# Patient Record
Sex: Male | Born: 1955 | Race: White | Hispanic: No | Marital: Married | Smoking: Current every day smoker
Health system: Southern US, Community
[De-identification: ages and names within clinical notes are randomized; demographics above are authoritative.]

## PROBLEM LIST (undated history)

## (undated) DIAGNOSIS — K449 Diaphragmatic hernia without obstruction or gangrene: Secondary | ICD-10-CM

## (undated) DIAGNOSIS — K219 Gastro-esophageal reflux disease without esophagitis: Secondary | ICD-10-CM

## (undated) DIAGNOSIS — L309 Dermatitis, unspecified: Secondary | ICD-10-CM

## (undated) DIAGNOSIS — I10 Essential (primary) hypertension: Secondary | ICD-10-CM

## (undated) HISTORY — PX: ROTATOR CUFF REPAIR: SHX139

---

## 2007-10-09 ENCOUNTER — Emergency Department: Payer: Self-pay | Admitting: Unknown Physician Specialty

## 2007-10-09 ENCOUNTER — Other Ambulatory Visit: Payer: Self-pay

## 2013-11-11 ENCOUNTER — Ambulatory Visit: Payer: Self-pay | Admitting: Nurse Practitioner

## 2018-09-08 DIAGNOSIS — Z720 Tobacco use: Secondary | ICD-10-CM | POA: Insufficient documentation

## 2019-05-17 ENCOUNTER — Other Ambulatory Visit: Payer: Self-pay | Admitting: Internal Medicine

## 2019-05-17 DIAGNOSIS — K219 Gastro-esophageal reflux disease without esophagitis: Secondary | ICD-10-CM

## 2019-05-17 DIAGNOSIS — R1310 Dysphagia, unspecified: Secondary | ICD-10-CM

## 2019-05-20 ENCOUNTER — Other Ambulatory Visit: Payer: Self-pay

## 2019-05-20 ENCOUNTER — Ambulatory Visit
Admission: RE | Admit: 2019-05-20 | Discharge: 2019-05-20 | Disposition: A | Discharge status: Home / Self Care | Payer: Medicare Other | Source: Ambulatory Visit | Attending: Internal Medicine | Admitting: Internal Medicine

## 2019-05-20 DIAGNOSIS — K219 Gastro-esophageal reflux disease without esophagitis: Secondary | ICD-10-CM | POA: Diagnosis not present

## 2019-05-20 DIAGNOSIS — R1310 Dysphagia, unspecified: Secondary | ICD-10-CM

## 2019-06-06 ENCOUNTER — Other Ambulatory Visit: Payer: Self-pay

## 2019-06-06 ENCOUNTER — Encounter: Payer: Self-pay | Admitting: Gastroenterology

## 2019-06-06 ENCOUNTER — Ambulatory Visit: Payer: Medicare Other | Admitting: Gastroenterology

## 2019-06-06 VITALS — BP 155/97 | HR 77 | Temp 98.1°F | Ht 65.0 in | Wt 179.5 lb

## 2019-06-06 DIAGNOSIS — R1319 Other dysphagia: Secondary | ICD-10-CM

## 2019-06-06 DIAGNOSIS — R1310 Dysphagia, unspecified: Secondary | ICD-10-CM | POA: Diagnosis not present

## 2019-06-06 DIAGNOSIS — L309 Dermatitis, unspecified: Secondary | ICD-10-CM | POA: Insufficient documentation

## 2019-06-06 DIAGNOSIS — B019 Varicella without complication: Secondary | ICD-10-CM | POA: Insufficient documentation

## 2019-06-06 NOTE — Patient Instructions (Signed)

## 2019-06-06 NOTE — Progress Notes (Signed)
Arlyss Repress, MD [address]  [address]  [place], [place] [address]  Main: [phone]  Fax: [phone]    Gastroenterology Consultation  Referring Provider:     Marguarite Arbour, MD Primary Care Physician:  Marguarite Arbour, MD Primary Gastroenterologist:  Dr. Arlyss Repress Reason for Consultation:     Dysphagia        HPI:   Rasul Decola Nill is a 64 y.o. male referred by Dr. Judithann Sheen, Duane Lope, MD  for consultation & management of dysphagia  Patient was originally seen by Dr. Judithann Sheen on 05/17/2019 secondary to approximately 1 year history of sensation of food stuck, gas bubble in his throat.  For last 2 months, he noticed worsening dysphagia and choking.  He had no heartburn or indigestion.  Denies nausea or vomiting, change in bowel habits, abdominal pain, patient underwent barium esophagogram which revealed irregular thickening of the distal esophagus, small hiatal hernia.  Therefore, GI is consulted for further evaluation Patient is currently on Protonix 40 mg twice daily which has remarkably help with his symptoms.  He acknowledges drinking 3 to 4 cans of sodas daily, consumes red meat regularly  He smokes cigars regularly Patient does not want a colonoscopy, he is undergoing stool testing for colon cancer screening  He denies family history of GI malignancy  NSAIDs: None  Antiplts/Anticoagulants/Anti thrombotics: None  GI Procedures: None  History reviewed. No pertinent past medical history.  History reviewed. No pertinent surgical history.  Current Outpatient Medications:  .  acetaminophen (TYLENOL) 325 MG tablet, Take by mouth., Disp: , Rfl:  .  clobetasol ointment (TEMOVATE) 0.05 %, Apply twice daily to raised itchy areas until smooth, Disp: , Rfl:  .  pantoprazole (PROTONIX) 40 MG tablet, Take 40 mg by mouth 2 (two) times daily., Disp: , Rfl:     History reviewed. No pertinent family history.   Social History   Tobacco Use  . Smoking status:  Current Every Day Smoker    Types: Cigars  . Smokeless tobacco: Never Used  Substance Use Topics  . Alcohol use: Not Currently  . Drug use: Never    Allergies as of 06/06/2019  . (No Known Allergies)    Review of Systems:    All systems reviewed and negative except where noted in HPI.   Physical Exam:  BP (!) 155/97 (BP Location: Left Arm, Patient Position: Sitting, Cuff Size: Normal)   Pulse 77   Temp 98.1 F (36.7 C) (Oral)   Ht 5\' 5"  (1.651 m)   Wt 179 lb 8 oz (81.4 kg)   BMI 29.87 kg/m  No LMP for male patient.  General:   Alert,  Well-developed, well-nourished, pleasant and cooperative in NAD Head:  Normocephalic and atraumatic. Eyes:  Sclera clear, no icterus.   Conjunctiva pink. Ears:  Normal auditory acuity. Nose:  No deformity, discharge, or lesions. Mouth:  No deformity or lesions,oropharynx pink & moist. Neck:  Supple; no masses or thyromegaly. Lungs:  Respirations even and unlabored.  Clear throughout to auscultation.   No wheezes, crackles, or rhonchi. No acute distress. Heart:  Regular rate and rhythm; no murmurs, clicks, rubs, or gallops. Abdomen:  Normal bowel sounds. Soft, obese, non-tender and moderately distended without masses, hepatosplenomegaly or hernias noted.  No guarding or rebound tenderness.   Rectal: Not performed Msk:  Symmetrical without gross deformities. Good, equal movement & strength bilaterally. Pulses:  Normal pulses noted. Extremities:  No clubbing or edema.  No cyanosis. Neurologic:  Alert and oriented x3;  grossly normal neurologically. Skin:  Intact without significant lesions or rashes. No jaundice. Psych:  Alert and cooperative. Normal mood and affect.  Imaging Studies: X-ray esophagus IMPRESSION: 1. Mild narrowing and irregularity of the distal esophagus at the gastroesophageal junction. To exclude a focal fixed lesion endoscopic evaluation is suggested.  2. Small sliding hiatal hernia. No reflux. Standard barium  tablet passes normally.  Assessment and Plan:   Noelle Hoogland Smyth is a 64 y.o. male with history of worsening dysphagia, symptoms currently relieved on acid suppressive therapy  Chronic dysphagia to solids: Continue Protonix 40 mg twice daily before meals Discussed about antireflux lifestyle, information provided EGD with proximal to distal esophageal biopsies as well as to evaluate for any structural lesions including malignancy  Follow up in 2 months   Cephas Darby, MD

## 2019-06-29 ENCOUNTER — Other Ambulatory Visit: Admission: RE | Admit: 2019-06-29 | Payer: Medicare Other | Source: Ambulatory Visit

## 2019-08-03 ENCOUNTER — Ambulatory Visit: Payer: Medicare Other | Admitting: Gastroenterology

## 2019-09-12 ENCOUNTER — Telehealth: Payer: Self-pay

## 2019-09-12 NOTE — Telephone Encounter (Signed)
Patients wife contacted the office to cancel his EGD.  She said she will need to reschedule it due to-she is going to have surgery this month.  She will call back to reschedule at a later time.  Thanks,  [place], [place]

## 2019-09-19 ENCOUNTER — Encounter: Admission: RE | Payer: Self-pay | Source: Home / Self Care

## 2019-09-19 ENCOUNTER — Ambulatory Visit: Admission: RE | Admit: 2019-09-19 | Payer: Medicare Other | Source: Home / Self Care | Admitting: Gastroenterology

## 2019-09-19 SURGERY — ESOPHAGOGASTRODUODENOSCOPY (EGD) WITH PROPOFOL
Anesthesia: General

## 2019-09-27 ENCOUNTER — Other Ambulatory Visit: Payer: Self-pay | Admitting: Internal Medicine

## 2019-09-27 DIAGNOSIS — R9389 Abnormal findings on diagnostic imaging of other specified body structures: Secondary | ICD-10-CM

## 2019-10-17 ENCOUNTER — Other Ambulatory Visit: Payer: Self-pay

## 2019-10-17 ENCOUNTER — Ambulatory Visit
Admission: RE | Admit: 2019-10-17 | Discharge: 2019-10-17 | Disposition: A | Discharge status: Home / Self Care | Payer: Medicare Other | Source: Ambulatory Visit | Attending: Internal Medicine | Admitting: Internal Medicine

## 2019-10-17 DIAGNOSIS — R9389 Abnormal findings on diagnostic imaging of other specified body structures: Secondary | ICD-10-CM | POA: Diagnosis not present

## 2019-10-19 ENCOUNTER — Ambulatory Visit: Payer: Medicare Other | Admitting: Gastroenterology

## 2021-05-23 IMAGING — CT CT CHEST W/O CM
2 of 4 series · 15 of 36 positions shown, 18 images · non-contrast
Comparison: By report from 09/23/2019

CLINICAL DATA: Follow-up abnormal chest x-ray

EXAM:
CT CHEST WITHOUT CONTRAST
TECHNIQUE: Multidetector CT imaging of the chest was performed following the
standard protocol without IV contrast.

[Series 2: thorax · axial · 0.68mm/px · z∈[-326,-76]mm · 12 of 147 slices shown, 15 images]
[im 11/147  mediastinal]
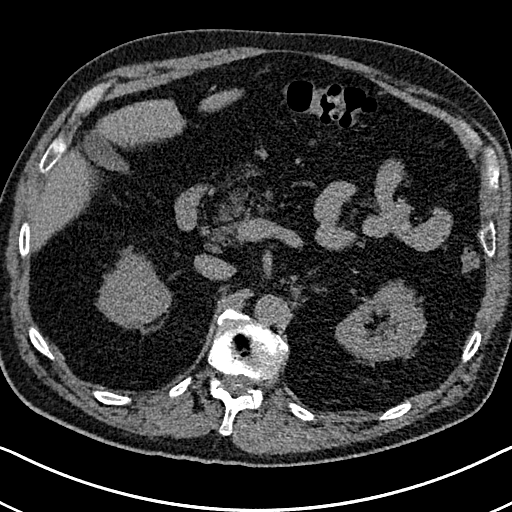
[im 11/147  lung]
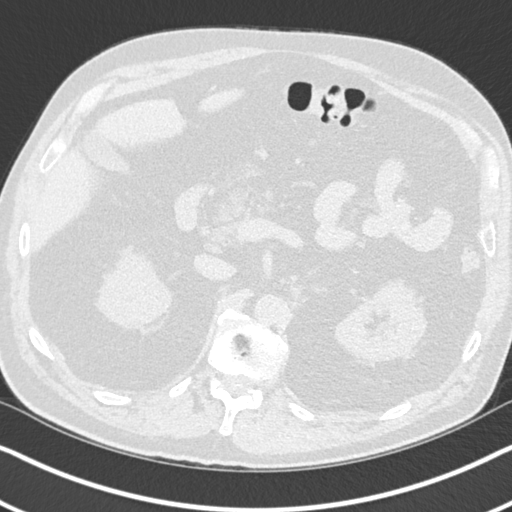
[im 21/147  lung]
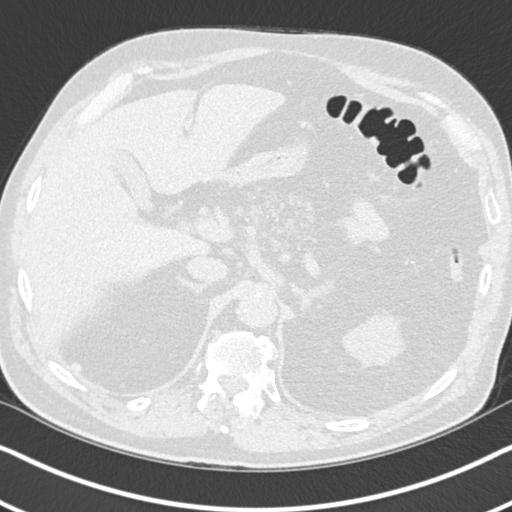
[im 32/147  lung]
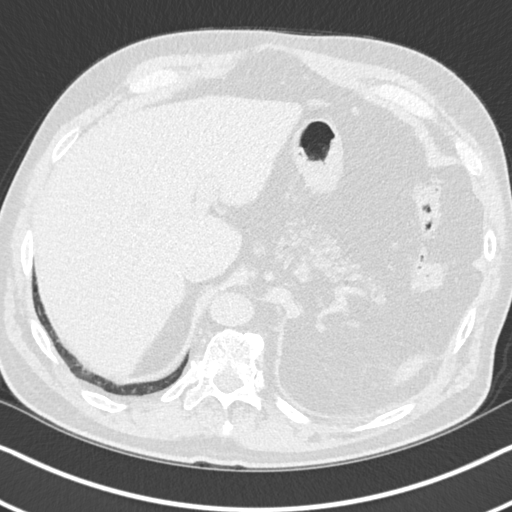
[im 42/147  lung]
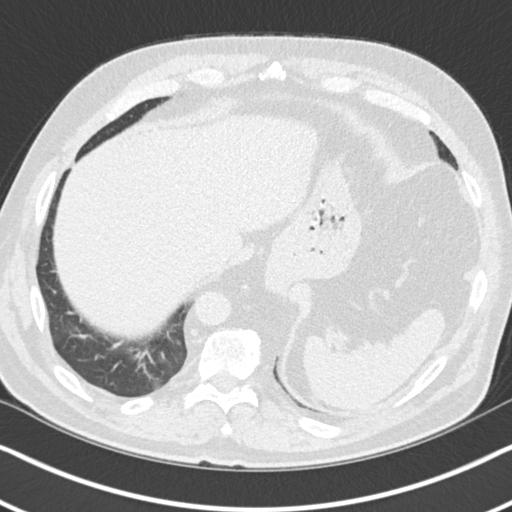
[im 53/147  mediastinal]
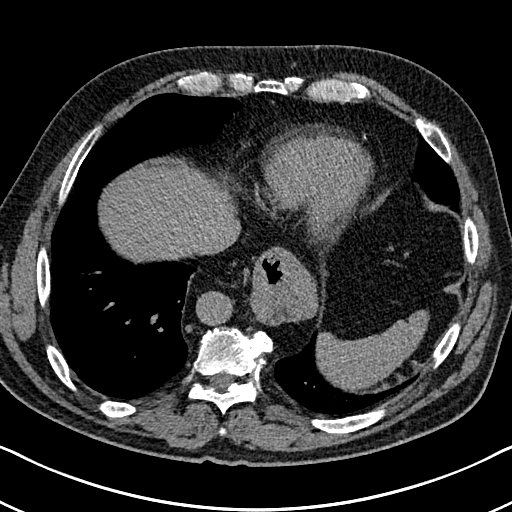
[im 53/147  lung]
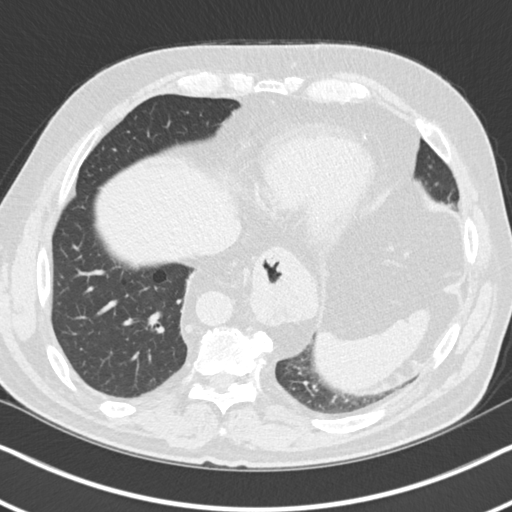
[im 63/147  lung]
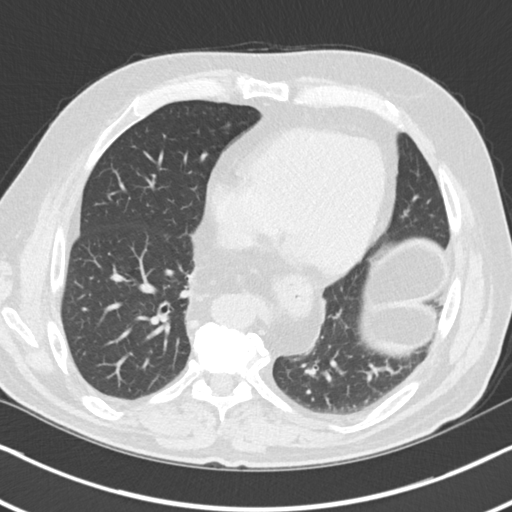
[im 84/147  lung]
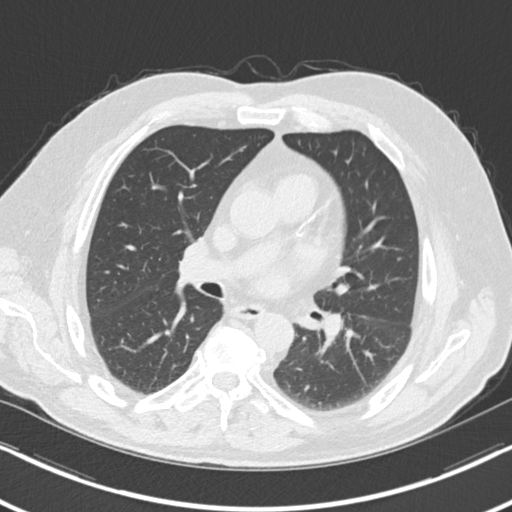
[im 94/147  lung]
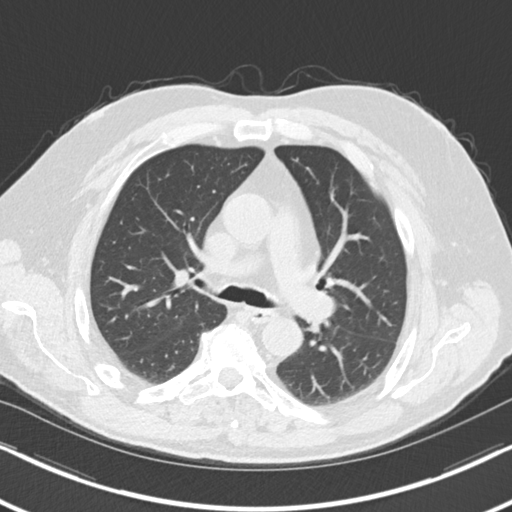
[im 105/147  mediastinal]
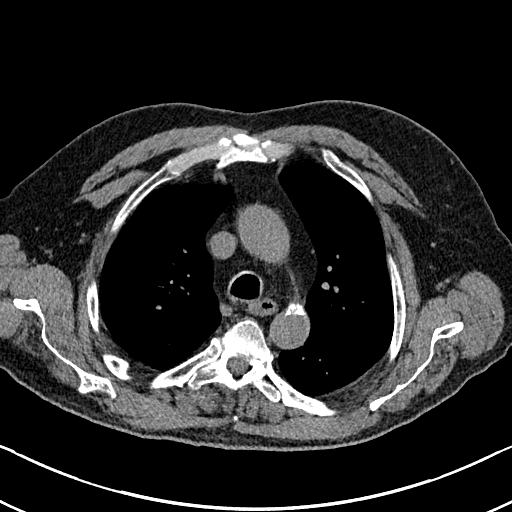
[im 105/147  lung]
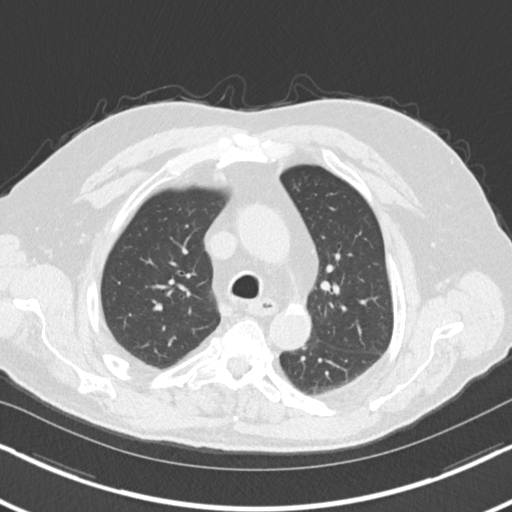
[im 115/147  lung]
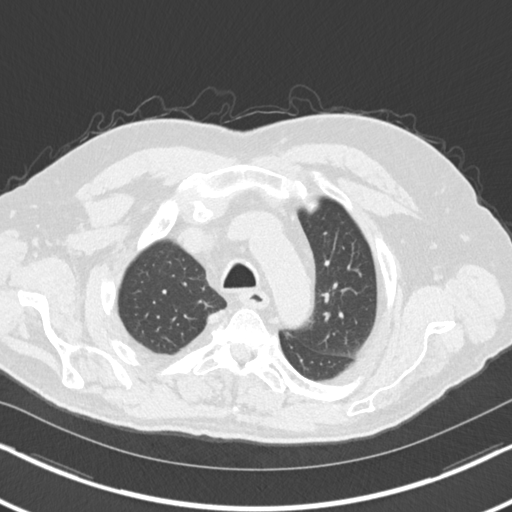
[im 126/147  lung]
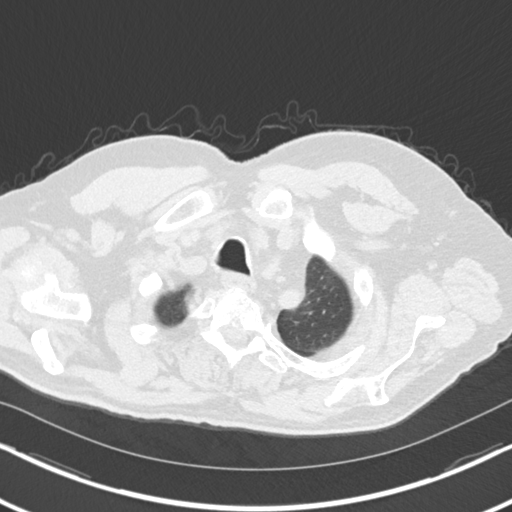
[im 136/147  lung]
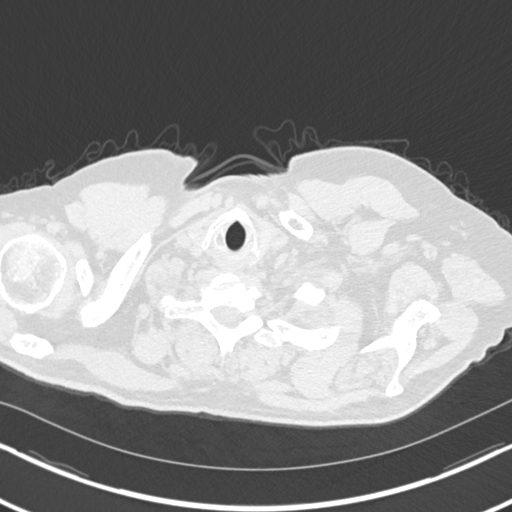

[Series 5: coronal · coronal · 0.61mm/px · 3 of 151 slices shown]
[im 31/151  lung]
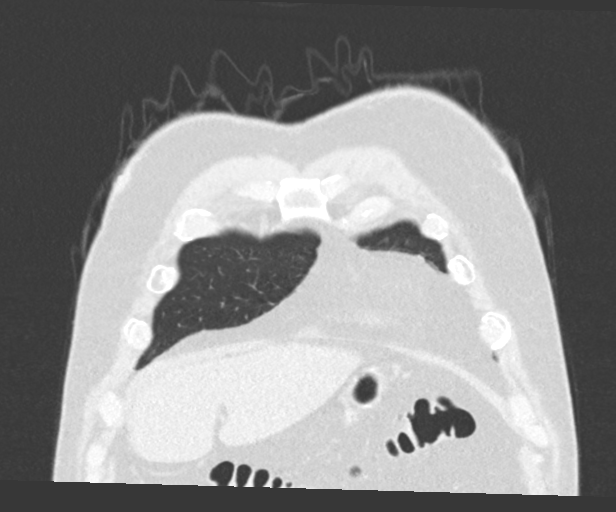
[im 61/151  lung]
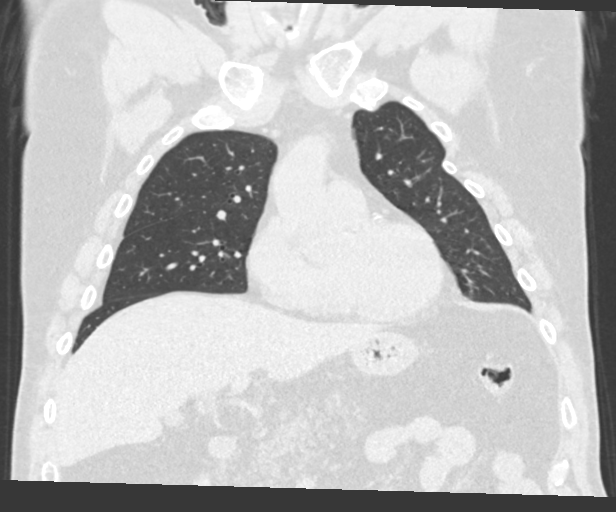
[im 91/151  lung]
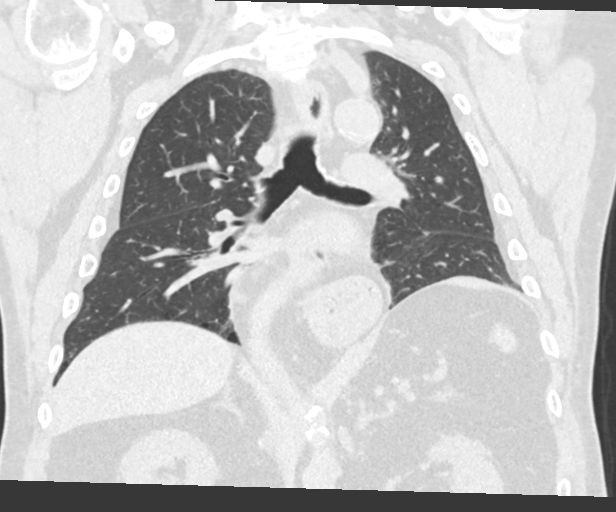

[15 of 36 positions shown; findings below may reference images not displayed]

FINDINGS: Cardiovascular: Limited by the lack of IV contrast. Aortic
calcifications are noted without aneurysmal dilatation. No cardiac
enlargement is seen. Coronary calcifications are noted. Pulmonary
artery is within normal limits as visualized.

Mediastinum/Nodes: Thoracic inlet is unremarkable. No sizable hilar
or mediastinal adenopathy is noted. The esophagus shows evidence of
a moderate sliding-type hiatal hernia.

Lungs/Pleura: The lungs are well aerated bilaterally. No focal
infiltrate or sizable effusion is seen. Previously noted atelectatic
changes are not appreciated on this exam. No pulmonary nodule is
seen.

Upper Abdomen: Visualized upper abdomen demonstrates fatty
interdigitation in the pancreas. No other focal abnormality is
noted.

Musculoskeletal: Degenerative changes of the thoracic spine are
seen. No acute bony abnormality is noted.
IMPRESSION: Previously described atelectatic changes and effusions are not
appreciated on today's exam and were likely related to poor
inspiratory effort.

Moderate-sized hiatal hernia.

Aortic Atherosclerosis ([id number]).

## 2023-01-22 DIAGNOSIS — Z125 Encounter for screening for malignant neoplasm of prostate: Secondary | ICD-10-CM | POA: Diagnosis not present

## 2023-01-22 DIAGNOSIS — E7800 Pure hypercholesterolemia, unspecified: Secondary | ICD-10-CM | POA: Diagnosis not present

## 2023-03-05 DIAGNOSIS — L570 Actinic keratosis: Secondary | ICD-10-CM | POA: Diagnosis not present

## 2023-03-05 DIAGNOSIS — L309 Dermatitis, unspecified: Secondary | ICD-10-CM | POA: Diagnosis not present

## 2023-03-05 DIAGNOSIS — Z85828 Personal history of other malignant neoplasm of skin: Secondary | ICD-10-CM | POA: Diagnosis not present

## 2023-03-05 DIAGNOSIS — L821 Other seborrheic keratosis: Secondary | ICD-10-CM | POA: Diagnosis not present

## 2023-03-05 DIAGNOSIS — Z08 Encounter for follow-up examination after completed treatment for malignant neoplasm: Secondary | ICD-10-CM | POA: Diagnosis not present

## 2023-03-05 DIAGNOSIS — L578 Other skin changes due to chronic exposure to nonionizing radiation: Secondary | ICD-10-CM | POA: Diagnosis not present

## 2023-03-18 DIAGNOSIS — I10 Essential (primary) hypertension: Secondary | ICD-10-CM | POA: Diagnosis not present

## 2023-03-18 DIAGNOSIS — Z79899 Other long term (current) drug therapy: Secondary | ICD-10-CM | POA: Diagnosis not present

## 2023-03-18 DIAGNOSIS — K219 Gastro-esophageal reflux disease without esophagitis: Secondary | ICD-10-CM | POA: Diagnosis not present

## 2023-03-18 DIAGNOSIS — Z720 Tobacco use: Secondary | ICD-10-CM | POA: Diagnosis not present

## 2023-03-18 DIAGNOSIS — E7800 Pure hypercholesterolemia, unspecified: Secondary | ICD-10-CM | POA: Diagnosis not present

## 2023-03-18 DIAGNOSIS — Z125 Encounter for screening for malignant neoplasm of prostate: Secondary | ICD-10-CM | POA: Diagnosis not present

## 2023-03-18 DIAGNOSIS — E039 Hypothyroidism, unspecified: Secondary | ICD-10-CM | POA: Diagnosis not present

## 2023-04-21 ENCOUNTER — Emergency Department

## 2023-04-21 ENCOUNTER — Other Ambulatory Visit: Payer: Self-pay

## 2023-04-21 DIAGNOSIS — R188 Other ascites: Secondary | ICD-10-CM | POA: Diagnosis not present

## 2023-04-21 DIAGNOSIS — R1013 Epigastric pain: Secondary | ICD-10-CM | POA: Diagnosis not present

## 2023-04-21 DIAGNOSIS — K449 Diaphragmatic hernia without obstruction or gangrene: Secondary | ICD-10-CM | POA: Diagnosis not present

## 2023-04-21 DIAGNOSIS — J9811 Atelectasis: Secondary | ICD-10-CM | POA: Diagnosis not present

## 2023-04-21 DIAGNOSIS — K5790 Diverticulosis of intestine, part unspecified, without perforation or abscess without bleeding: Secondary | ICD-10-CM | POA: Diagnosis not present

## 2023-04-21 DIAGNOSIS — I700 Atherosclerosis of aorta: Secondary | ICD-10-CM | POA: Diagnosis not present

## 2023-04-21 DIAGNOSIS — K760 Fatty (change of) liver, not elsewhere classified: Secondary | ICD-10-CM | POA: Diagnosis not present

## 2023-04-21 DIAGNOSIS — R079 Chest pain, unspecified: Secondary | ICD-10-CM | POA: Diagnosis not present

## 2023-04-21 DIAGNOSIS — R109 Unspecified abdominal pain: Secondary | ICD-10-CM | POA: Diagnosis not present

## 2023-04-21 DIAGNOSIS — R0789 Other chest pain: Secondary | ICD-10-CM | POA: Diagnosis not present

## 2023-04-21 LAB — COMPREHENSIVE METABOLIC PANEL WITH GFR
ALT: 22 U/L (ref 0–44)
AST: 18 U/L (ref 15–41)
Albumin: 4.2 g/dL (ref 3.5–5.0)
Alkaline Phosphatase: 85 U/L (ref 38–126)
Anion gap: 11 (ref 5–15)
BUN: 12 mg/dL (ref 8–23)
CO2: 20 mmol/L — ABNORMAL LOW (ref 22–32)
Calcium: 9.7 mg/dL (ref 8.9–10.3)
Chloride: 101 mmol/L (ref 98–111)
Creatinine, Ser: 0.92 mg/dL (ref 0.61–1.24)
GFR, Estimated: >60 mL/min (ref >60)
Glucose, Bld: 175 mg/dL — ABNORMAL HIGH (ref 70–99)
Potassium: 3.9 mmol/L (ref 3.5–5.1)
Sodium: 132 mmol/L — ABNORMAL LOW (ref 135–145)
Total Bilirubin: 1 mg/dL (ref 0.0–1.2)
Total Protein: 7.5 g/dL (ref 6.5–8.1)

## 2023-04-21 LAB — CBC
HCT: 51.4 % (ref 39.0–52.0)
Hemoglobin: 18.1 g/dL — ABNORMAL HIGH (ref 13.0–17.0)
MCH: 29.9 pg (ref 26.0–34.0)
MCHC: 35.2 g/dL (ref 30.0–36.0)
MCV: 84.8 fL (ref 80.0–100.0)
Platelets: 291 10*3/uL (ref 150–400)
RBC: 6.06 MIL/uL — ABNORMAL HIGH (ref 4.22–5.81)
RDW: 13.3 % (ref 11.5–15.5)
WBC: 9.2 10*3/uL (ref 4.0–10.5)
nRBC: 0 % (ref 0.0–0.2)

## 2023-04-21 LAB — LIPASE, BLOOD: Lipase: 24 U/L (ref 11–51)

## 2023-04-21 LAB — TROPONIN I (HIGH SENSITIVITY): Troponin I (High Sensitivity): 4 ng/L (ref <18)

## 2023-04-21 NOTE — ED Triage Notes (Addendum)
 Pt to ED via POV c/o abd pain that starts at umbilicus and radiates up to mid chest. Started last night and has been worsening. Pt SHOB, dizziness, fevers.

## 2023-04-22 ENCOUNTER — Emergency Department
Admission: EM | Admit: 2023-04-22 | Discharge: 2023-04-22 | Disposition: A | Discharge status: Home / Self Care | Attending: Emergency Medicine | Admitting: Emergency Medicine

## 2023-04-22 ENCOUNTER — Emergency Department

## 2023-04-22 DIAGNOSIS — K5790 Diverticulosis of intestine, part unspecified, without perforation or abscess without bleeding: Secondary | ICD-10-CM | POA: Diagnosis not present

## 2023-04-22 DIAGNOSIS — R109 Unspecified abdominal pain: Secondary | ICD-10-CM

## 2023-04-22 DIAGNOSIS — R188 Other ascites: Secondary | ICD-10-CM | POA: Diagnosis not present

## 2023-04-22 DIAGNOSIS — K449 Diaphragmatic hernia without obstruction or gangrene: Secondary | ICD-10-CM | POA: Diagnosis not present

## 2023-04-22 LAB — URINALYSIS, ROUTINE W REFLEX MICROSCOPIC
Bilirubin Urine: NEGATIVE
Glucose, UA: NEGATIVE mg/dL
Hgb urine dipstick: NEGATIVE
Ketones, ur: NEGATIVE mg/dL
Leukocytes,Ua: NEGATIVE
Nitrite: NEGATIVE
Protein, ur: NEGATIVE mg/dL
Specific Gravity, Urine: >1.046 — ABNORMAL HIGH (ref 1.005–1.030)
pH: 5 (ref 5.0–8.0)

## 2023-04-22 LAB — TROPONIN I (HIGH SENSITIVITY): Troponin I (High Sensitivity): 3 ng/L (ref <18)

## 2023-04-22 MED ORDER — FAMOTIDINE 20 MG PO TABS: 20.0000 mg | ORAL_TABLET | Freq: Two times a day (BID) | ORAL | 0 refills | Status: AC

## 2023-04-22 MED ORDER — LIDOCAINE VISCOUS HCL 2 % MT SOLN
15.0000 mL | Freq: Once | OROMUCOSAL | Status: AC
  Administered 2023-04-22: 15 mL via OROMUCOSAL
  Filled 2023-04-22: qty 15

## 2023-04-22 MED ORDER — FAMOTIDINE IN NACL 20-0.9 MG/50ML-% IV SOLN
20.0000 mg | Freq: Once | INTRAVENOUS | Status: AC
  Administered 2023-04-22: 20 mg via INTRAVENOUS
  Filled 2023-04-22: qty 50

## 2023-04-22 MED ORDER — ALUM & MAG HYDROXIDE-SIMETH 200-200-20 MG/5ML PO SUSP
30.0000 mL | Freq: Once | ORAL | Status: AC
  Administered 2023-04-22: 30 mL via ORAL
  Filled 2023-04-22: qty 30

## 2023-04-22 MED ORDER — IOHEXOL 300 MG/ML  SOLN
100.0000 mL | Freq: Once | INTRAMUSCULAR | Status: AC | PRN
  Administered 2023-04-22: 100 mL via INTRAVENOUS

## 2023-04-22 NOTE — Discharge Instructions (Addendum)
 Start Pepcid twice daily.  Bland diet x 1 week, then slowly advance diet as tolerated.  Return to the ER for worsening symptoms, persistent vomiting, difficulty breathing or other concerns.

## 2023-04-22 NOTE — ED Provider Notes (Signed)
 [facility] Provider Note    Event Date/Time   First MD Initiated Contact with Patient 04/22/23 [phone]     (approximate)   History   Abdominal Pain and Chest Pain   HPI  Jonathan Rhodes is a 68 y.o. male who presents to the ED from home with a chief complaint of abdominal pain which began last night.  Reports central abdominal pain starting at his umbilicus radiating to his epigastrium/lower chest.  Endorses brief nausea.  Denies fever/chills, shortness of breath, vomiting, dysuria or diarrhea.  Wife states patient has been coughing but not "like a cold cough".     Past Medical History  History reviewed. No pertinent past medical history.   Active Problem List   Patient Active Problem List   Diagnosis Date Noted   Chickenpox 06/06/2019   Eczema 06/06/2019   Tobacco use 09/08/2018     Past Surgical History  History reviewed. No pertinent surgical history.   Home Medications   Prior to Admission medications   Medication Sig Start Date End Date Taking? Authorizing Provider  famotidine (PEPCID) 20 MG tablet Take 1 tablet (20 mg total) by mouth 2 (two) times daily. 04/22/23  Yes Irean Hong, MD  acetaminophen (TYLENOL) 325 MG tablet Take by mouth.    [provider]  clobetasol ointment (TEMOVATE) 0.05 % Apply twice daily to raised itchy areas until smooth 08/27/18   [provider]  pantoprazole (PROTONIX) 40 MG tablet Take 40 mg by mouth 2 (two) times daily. 05/17/19   [provider]     Allergies  Patient has no known allergies.   Family History  History reviewed. No pertinent family history.   Physical Exam  Triage Vital Signs: ED Triage Vitals [04/21/23 2244]  Encounter Vitals Group     BP (!) 152/100     Systolic BP Percentile      Diastolic BP Percentile      Pulse Rate (!) 103     Resp 18     Temp 97.6 F (36.4 C)     Temp Source Oral     SpO2 95 %     Weight 192 lb (87.1 kg)     Height 5\' 6"   (1.676 m)     Head Circumference      Peak Flow      Pain Score 9     Pain Loc      Pain Education      Exclude from Growth Chart     Updated Vital Signs: BP (!) 149/92   Pulse 87   Temp 98 F (36.7 C) (Oral)   Resp (!) 26   Ht 5\' 6"  (1.676 m)   Wt 87.1 kg   SpO2 94%   BMI 30.99 kg/m    General: Awake, no distress.  CV:  RRR.  Good peripheral perfusion.  Resp:  Normal effort.  CTAB. Abd:  Minimal epigastric tenderness to palpation without rebound or guarding. No distention.  Other:  No truncal vesicles.  ED Results / Procedures / Treatments  Labs (all labs ordered are listed, but only abnormal results are displayed) Labs Reviewed  COMPREHENSIVE METABOLIC PANEL WITH GFR - Abnormal; Notable for the following components:      Result Value   Sodium 132 (*)    CO2 20 (*)    Glucose, Bld 175 (*)    All other components within normal limits  CBC - Abnormal; Notable for the following components:   RBC  6.06 (*)    Hemoglobin 18.1 (*)    All other components within normal limits  URINALYSIS, ROUTINE W REFLEX MICROSCOPIC - Abnormal; Notable for the following components:   Color, Urine STRAW (*)    APPearance CLEAR (*)    Specific Gravity, Urine >1.046 (*)    All other components within normal limits  LIPASE, BLOOD  TROPONIN I (HIGH SENSITIVITY)  TROPONIN I (HIGH SENSITIVITY)     EKG  ED ECG REPORT I, Lynnmarie Lovett J, the attending physician, personally viewed and interpreted this ECG.   Date: 04/22/2023  EKG Time: 2241  Rate: 110  Rhythm: sinus tachycardia  Axis: Normal  Intervals:none  ST&T Change: Nonspecific    RADIOLOGY I have independently visualized and interpreted patient's imaging studies as well as noted the radiology interpretation:  Chest x-ray: No acute cardiopulmonary process, dilated small bowel loops in upper abdomen  Ultrasound: Hepatic steatosis  CT abdomen/pelvis: No acute intra-abdominal process, moderate hiatal hernia, enlarged since  prior examination  Official radiology report(s): CT ABDOMEN PELVIS W CONTRAST Result Date: 04/22/2023 CLINICAL DATA:  Unspecified abdominal pain, mid chest pain EXAM: CT ABDOMEN AND PELVIS WITH CONTRAST TECHNIQUE: Multidetector CT imaging of the abdomen and pelvis was performed using the standard protocol following bolus administration of intravenous contrast. RADIATION DOSE REDUCTION: This exam was performed according to the departmental dose-optimization program which includes automated exposure control, adjustment of the mA and/or kV according to patient size and/or use of iterative reconstruction technique. CONTRAST:  OMNIPAQUE IOHEXOL 300 MG/ML  SOLN COMPARISON:  10/09/2007 FINDINGS: Lower chest: Extensive coronary artery calcification. Global cardiac size within normal limits. Visualized lung bases are clear. Moderate hiatal hernia, enlarged since prior examination Hepatobiliary: No focal liver abnormality is seen. No gallstones, gallbladder wall thickening, or biliary dilatation. Pancreas: Unremarkable Spleen: Unremarkable Adrenals/Urinary Tract: Adrenal glands are unremarkable. Kidneys are normal, without renal calculi, focal lesion, or hydronephrosis. Bladder is unremarkable. Stomach/Bowel: Moderate distal diverticulosis. Stomach, small bowel, and large bowel are otherwise unremarkable. Appendix absent. No evidence of obstruction or focal inflammation. No free intraperitoneal gas. Trace ascites. Vascular/Lymphatic: No significant vascular findings are present. No enlarged abdominal or pelvic lymph nodes. Reproductive: Mild prostatic enlargement. Other: No abdominal wall hernia or abnormality. No abdominopelvic ascites. Musculoskeletal: Osseous structures are age-appropriate. No acute. No lytic or blastic bone lesion. IMPRESSION: 1. No acute intra-abdominal pathology identified. No definite radiographic explanation for the patient's reported symptoms. 2. Extensive coronary artery calcification. 3.  Moderate hiatal hernia, enlarged since prior examination. 4. Moderate distal diverticulosis without superimposed acute inflammatory change. 5. Trace ascites, nonspecific. Electronically Signed   By: Helyn Numbers M.D.   On: 04/22/2023 02:28   US ABDOMEN LIMITED RUQ (LIVER/GB) Result Date: 04/22/2023 CLINICAL DATA:  Mid abdominal pain EXAM: ULTRASOUND ABDOMEN LIMITED RIGHT UPPER QUADRANT COMPARISON:  None Available. FINDINGS: Gallbladder: No gallstones or wall thickening visualized. No sonographic Murphy sign noted by sonographer. Common bile duct: Diameter: 3 mm in proximal thigh Liver: Hepatic parenchymal echogenicity is diffusely increased and there is coarsening of the hepatic echotexture in keeping with mild hepatic steatosis. No focal intrahepatic masses are seen and there is no intrahepatic biliary ductal dilation. Portal vein is patent on color Doppler imaging with normal direction of blood flow towards the liver. Other: None. IMPRESSION: 1. Mild hepatic steatosis. Electronically Signed   By: Helyn Numbers M.D.   On: 04/22/2023 00:13   DG Chest 2 View Result Date: 04/21/2023 CLINICAL DATA:  Chest pain EXAM: CHEST - 2 VIEW COMPARISON:  .  CT chest 10/17/2019 FINDINGS: Normal cardiomediastinal silhouette. Aortic atherosclerotic calcification. Left basilar atelectasis. The lungs are otherwise clear. No displaced rib fractures. Mildly dilated small bowel in the upper abdomen. IMPRESSION: 1. Left basilar atelectasis.  No acute cardiopulmonary process. 2. Dilated loops of small bowel in the upper abdomen. If there is concern for obstruction CT abdomen pelvis with IV contrast is recommended. Electronically Signed   By: Minerva Fester M.D.   On: 04/21/2023 23:11     PROCEDURES:  Critical Care performed: No  Procedures   MEDICATIONS ORDERED IN ED: Medications  iohexol (OMNIPAQUE) 300 MG/ML solution 100 mL (100 mLs Intravenous Contrast Given 04/22/23 0217)  famotidine (PEPCID) IVPB 20 mg premix (0 mg  Intravenous Stopped 04/22/23 0414)  alum & mag hydroxide-simeth (MAALOX/MYLANTA) 200-200-20 MG/5ML suspension 30 mL (30 mLs Oral Given 04/22/23 0347)  lidocaine (XYLOCAINE) 2 % viscous mouth solution 15 mL (15 mLs Mouth/Throat Given 04/22/23 0347)     IMPRESSION / MDM / ASSESSMENT AND PLAN / ED COURSE  I reviewed the triage vital signs and the nursing notes.                             68 year old male presenting with abdominal pain. Differential diagnosis includes, but is not limited to, biliary disease (biliary colic, acute cholecystitis, cholangitis, choledocholithiasis, etc), intrathoracic causes for epigastric abdominal pain including ACS, gastritis, duodenitis, pancreatitis, small bowel or large bowel obstruction, abdominal aortic aneurysm, hernia, and ulcer(s).  I personally reviewed patient's records and note a PCP office visit on 03/18/2023 for GERD, hypothyroidism, hypertension, hypercholesterolemia.  Patient's presentation is most consistent with acute complicated illness / injury requiring diagnostic workup.  Laboratory results unremarkable including 2 sets of negative troponins, LFTs and lipase.  Chest x-ray is clear.  Ultrasound demonstrates hepatic steatosis.  CT abdomen/pelvis demonstrates no acute intra-abdominal pathology; moderate hiatal hernia noted which has enlarged since prior study.  Patient takes Protonix twice daily.  In the ED will give GI cocktail, IV Pepcid.  Will add Pepcid twice daily to patient's medication regimen and refer him to GI for outpatient follow-up.  Will reassess.  Clinical Course as of 04/22/23 0534  Wed Apr 22, 2023  0430 Patient has no complaints of pain.  Will discharge home with Pepcid twice daily and referred to GI for outpatient follow-up.  Strict return precautions given.  Patient and spouse verbalized understanding and agree with plan of care. [JS]    Clinical Course User Index [JS] Irean Hong, MD     FINAL CLINICAL IMPRESSION(S) / ED DIAGNOSES    Final diagnoses:  Abdominal pain, unspecified abdominal location  Hiatal hernia     Rx / DC Orders   ED Discharge Orders          Ordered    famotidine (PEPCID) 20 MG tablet  2 times daily        04/22/23 0350             Note:  This document was prepared using Dragon voice recognition software and may include unintentional dictation errors.   Irean Hong, MD 04/22/23 [phone]

## 2023-05-01 DIAGNOSIS — R1084 Generalized abdominal pain: Secondary | ICD-10-CM | POA: Diagnosis not present

## 2023-05-01 DIAGNOSIS — K219 Gastro-esophageal reflux disease without esophagitis: Secondary | ICD-10-CM | POA: Diagnosis not present

## 2023-07-09 DIAGNOSIS — K219 Gastro-esophageal reflux disease without esophagitis: Secondary | ICD-10-CM | POA: Diagnosis not present

## 2023-07-09 DIAGNOSIS — R1010 Upper abdominal pain, unspecified: Secondary | ICD-10-CM | POA: Diagnosis not present

## 2023-09-10 DIAGNOSIS — Z85828 Personal history of other malignant neoplasm of skin: Secondary | ICD-10-CM | POA: Diagnosis not present

## 2023-09-10 DIAGNOSIS — L538 Other specified erythematous conditions: Secondary | ICD-10-CM | POA: Diagnosis not present

## 2023-09-10 DIAGNOSIS — L820 Inflamed seborrheic keratosis: Secondary | ICD-10-CM | POA: Diagnosis not present

## 2023-09-10 DIAGNOSIS — Z08 Encounter for follow-up examination after completed treatment for malignant neoplasm: Secondary | ICD-10-CM | POA: Diagnosis not present

## 2023-09-10 DIAGNOSIS — L570 Actinic keratosis: Secondary | ICD-10-CM | POA: Diagnosis not present

## 2023-09-10 DIAGNOSIS — L578 Other skin changes due to chronic exposure to nonionizing radiation: Secondary | ICD-10-CM | POA: Diagnosis not present

## 2023-09-15 DIAGNOSIS — I10 Essential (primary) hypertension: Secondary | ICD-10-CM | POA: Diagnosis not present

## 2023-09-15 DIAGNOSIS — K219 Gastro-esophageal reflux disease without esophagitis: Secondary | ICD-10-CM | POA: Diagnosis not present

## 2023-09-15 DIAGNOSIS — Z79899 Other long term (current) drug therapy: Secondary | ICD-10-CM | POA: Diagnosis not present

## 2023-09-15 DIAGNOSIS — Z125 Encounter for screening for malignant neoplasm of prostate: Secondary | ICD-10-CM | POA: Diagnosis not present

## 2023-09-15 DIAGNOSIS — E039 Hypothyroidism, unspecified: Secondary | ICD-10-CM | POA: Diagnosis not present

## 2023-09-15 DIAGNOSIS — Z0000 Encounter for general adult medical examination without abnormal findings: Secondary | ICD-10-CM | POA: Diagnosis not present

## 2023-09-15 DIAGNOSIS — E7800 Pure hypercholesterolemia, unspecified: Secondary | ICD-10-CM | POA: Diagnosis not present

## 2023-09-15 DIAGNOSIS — Z720 Tobacco use: Secondary | ICD-10-CM | POA: Diagnosis not present

## 2023-09-18 ENCOUNTER — Other Ambulatory Visit: Payer: Self-pay

## 2023-09-18 ENCOUNTER — Ambulatory Visit
Admission: RE | Admit: 2023-09-18 | Discharge: 2023-09-18 | Disposition: A | Discharge status: Home / Self Care | Attending: Gastroenterology | Admitting: Gastroenterology

## 2023-09-18 ENCOUNTER — Ambulatory Visit: Admitting: Anesthesiology

## 2023-09-18 ENCOUNTER — Encounter
Admission: RE | Disposition: A | Discharge status: Home / Self Care | Payer: Self-pay | Source: Home / Self Care | Attending: Gastroenterology

## 2023-09-18 DIAGNOSIS — Q453 Other congenital malformations of pancreas and pancreatic duct: Secondary | ICD-10-CM | POA: Insufficient documentation

## 2023-09-18 DIAGNOSIS — E66813 Obesity, class 3: Secondary | ICD-10-CM | POA: Diagnosis not present

## 2023-09-18 DIAGNOSIS — F17200 Nicotine dependence, unspecified, uncomplicated: Secondary | ICD-10-CM | POA: Diagnosis not present

## 2023-09-18 DIAGNOSIS — Z6829 Body mass index (BMI) 29.0-29.9, adult: Secondary | ICD-10-CM | POA: Insufficient documentation

## 2023-09-18 DIAGNOSIS — K449 Diaphragmatic hernia without obstruction or gangrene: Secondary | ICD-10-CM | POA: Insufficient documentation

## 2023-09-18 DIAGNOSIS — K219 Gastro-esophageal reflux disease without esophagitis: Secondary | ICD-10-CM | POA: Diagnosis not present

## 2023-09-18 DIAGNOSIS — K2970 Gastritis, unspecified, without bleeding: Secondary | ICD-10-CM | POA: Diagnosis not present

## 2023-09-18 DIAGNOSIS — K2950 Unspecified chronic gastritis without bleeding: Secondary | ICD-10-CM | POA: Insufficient documentation

## 2023-09-18 DIAGNOSIS — I10 Essential (primary) hypertension: Secondary | ICD-10-CM | POA: Insufficient documentation

## 2023-09-18 DIAGNOSIS — Q399 Congenital malformation of esophagus, unspecified: Secondary | ICD-10-CM | POA: Insufficient documentation

## 2023-09-18 HISTORY — DX: Diaphragmatic hernia without obstruction or gangrene: K44.9

## 2023-09-18 HISTORY — DX: Essential (primary) hypertension: I10

## 2023-09-18 HISTORY — DX: Dermatitis, unspecified: L30.9

## 2023-09-18 HISTORY — PX: ESOPHAGOGASTRODUODENOSCOPY: SHX5428

## 2023-09-18 HISTORY — DX: Gastro-esophageal reflux disease without esophagitis: K21.9

## 2023-09-18 SURGERY — EGD (ESOPHAGOGASTRODUODENOSCOPY)
Anesthesia: General

## 2023-09-18 MED ORDER — SODIUM CHLORIDE 0.9 % IV SOLN: INTRAVENOUS | Status: DC

## 2023-09-18 MED ORDER — LIDOCAINE HCL (CARDIAC) PF 100 MG/5ML IV SOSY
PREFILLED_SYRINGE | INTRAVENOUS | Status: DC | PRN
  Administered 2023-09-18: 80 mg via INTRAVENOUS

## 2023-09-18 MED ORDER — PROPOFOL 10 MG/ML IV BOLUS
INTRAVENOUS | Status: DC | PRN
Start: 2023-09-18 — End: 2023-09-18
  Administered 2023-09-18: 70 mg via INTRAVENOUS
  Administered 2023-09-18 (×3): 20 mg via INTRAVENOUS
  Administered 2023-09-18: 10 mg via INTRAVENOUS

## 2023-09-18 MED ORDER — DEXMEDETOMIDINE HCL IN NACL 80 MCG/20ML IV SOLN
INTRAVENOUS | Status: DC | PRN
  Administered 2023-09-18: 8 ug via INTRAVENOUS

## 2023-09-18 NOTE — Op Note (Signed)
 [facility] Patient Name: Jonathan Rhodes Procedure Date: 09/18/2023 10:23 AM MRN: [id number] Account #: [id number] Date of Birth: 02-25-1955 Admit Type: Outpatient Age: 68 Room: [facility] ENDO ROOM 3 Gender: Male Note Status: Finalized Instrument Name: Upper GI Scope [phone] Procedure:             Upper GI endoscopy Indications:           Generalized abdominal pain, Gastro-esophageal reflux                         disease Providers:             Ole Schick MD, MD Referring MD:          Reyes BIRCH. Auston, MD (Referring MD) Medicines:             Monitored Anesthesia Care Complications:         No immediate complications. Estimated blood loss:                         Minimal. Procedure:             Pre-Anesthesia Assessment:                        - Prior to the procedure, a History and Physical was                         performed, and patient medications and allergies were                         reviewed. The patient is competent. The risks and                         benefits of the procedure and the sedation options and                         risks were discussed with the patient. All questions                         were answered and informed consent was obtained.                         Patient identification and proposed procedure were                         verified by the physician, the nurse, the                         anesthesiologist, the anesthetist and the technician                         in the endoscopy suite. Mental Status Examination:                         alert and oriented. Airway Examination: normal                         oropharyngeal airway and neck mobility. Respiratory  Examination: clear to auscultation. CV Examination:                         normal. Prophylactic Antibiotics: The patient does not                         require prophylactic antibiotics. Prior                          Anticoagulants: The patient has taken no anticoagulant                         or antiplatelet agents. ASA Grade Assessment: III - A                         patient with severe systemic disease. After reviewing                         the risks and benefits, the patient was deemed in                         satisfactory condition to undergo the procedure. The                         anesthesia plan was to use monitored anesthesia care                         (MAC). Immediately prior to administration of                         medications, the patient was re-assessed for adequacy                         to receive sedatives. The heart rate, respiratory                         rate, oxygen saturations, blood pressure, adequacy of                         pulmonary ventilation, and response to care were                         monitored throughout the procedure. The physical                         status of the patient was re-assessed after the                         procedure.                        After obtaining informed consent, the endoscope was                         passed under direct vision. Throughout the procedure,                         the patient's blood pressure, pulse, and oxygen  saturations were monitored continuously. The Endoscope                         was introduced through the mouth, and advanced to the                         second part of duodenum. The upper GI endoscopy was                         accomplished without difficulty. The patient tolerated                         the procedure well. Findings:      A 6 cm hiatal hernia was present.      The distal esophagus was mildly tortuous.      A single umbilicated lesion measuring 5 mm in diameter was found in the       gastric antrum. Biopsies were taken with a cold forceps for histology.       Estimated blood loss was minimal.      Patchy mild inflammation characterized by erythema  was found in the       gastric antrum. Biopsies were taken with a cold forceps for Helicobacter       pylori testing. Estimated blood loss was minimal.      The examined duodenum was normal. Impression:            - 6 cm hiatal hernia.                        - Tortuous esophagus.                        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 Biopsied.                        - Gastritis. Biopsied.                        - Normal examined duodenum. Recommendation:        - Discharge patient to home.                        - Resume previous diet.                        - Continue present medications.                        - Await pathology results.                        - Return to referring physician as previously                         scheduled. 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 All rights reserved. The codes documented in this report are preliminary and upon coder review may  be revised to meet current compliance requirements. Ole Schick MD, MD 09/18/2023 10:45:18 AM Number of Addenda: 0 Note Initiated On: 09/18/2023 10:23  AM Estimated Blood Loss:  Estimated blood loss was minimal.      [facility]

## 2023-09-18 NOTE — H&P (Signed)
 Outpatient short stay form Pre-procedure 09/18/2023  Jonathan ONEIDA Schick, MD  Primary Physician: Auston Reyes BIRCH, MD  Reason for visit:  GERD/Abdominal pain  History of present illness:    68 y/o gentleman with history of hypertension here for EGD for GERD/abdominal pain. No blood thinners. No family history of GI malignancies. No neck surgeries.    Current Facility-Administered Medications:    0.9 %  sodium chloride  infusion, , Intravenous, Continuous, Maigan Bittinger, Jonathan ONEIDA, MD, Last Rate: 20 mL/hr at 09/18/23 1021, Continued from Pre-op at 09/18/23 1021  Medications Prior to Admission  Medication Sig Dispense Refill Last Dose/Taking   acetaminophen (TYLENOL) 325 MG tablet Take by mouth.   Past Month   amitriptyline (ELAVIL) 25 MG tablet Take 25 mg by mouth at bedtime.   Past Week   amLODipine (NORVASC) 5 MG tablet Take 5 mg by mouth daily.   09/18/2023 Morning   famotidine  (PEPCID ) 20 MG tablet Take 1 tablet (20 mg total) by mouth 2 (two) times daily. 60 tablet 0 09/17/2023   levothyroxine (SYNTHROID) 50 MCG tablet Take 50 mcg by mouth daily before breakfast.   09/17/2023   pantoprazole (PROTONIX) 40 MG tablet Take 40 mg by mouth 2 (two) times daily.   09/17/2023   clobetasol ointment (TEMOVATE) 0.05 % Apply twice daily to raised itchy areas until smooth        No Known Allergies   Past Medical History:  Diagnosis Date   Eczema    GERD (gastroesophageal reflux disease)    Hiatal hernia    Hypertension     Review of systems:  Otherwise negative.    Physical Exam  Gen: Alert, oriented. Appears stated age.  HEENT: PERRLA. Lungs: No respiratory distress CV: RRR Abd: soft, benign, no masses Ext: No edema    Planned procedures: Proceed with EGD. The patient understands the nature of the planned procedure, indications, risks, alternatives and potential complications including but not limited to bleeding, infection, perforation, damage to internal organs and possible  oversedation/side effects from anesthesia. The patient agrees and gives consent to proceed.  Please refer to procedure notes for findings, recommendations and patient disposition/instructions.     Jonathan ONEIDA Schick, MD [facility]

## 2023-09-18 NOTE — Anesthesia Postprocedure Evaluation (Signed)
 Anesthesia Post Note  Patient: Jonathan Rhodes  Procedure(s) Performed: EGD (ESOPHAGOGASTRODUODENOSCOPY)  Patient location during evaluation: PACU Anesthesia Type: General Level of consciousness: awake Pain management: satisfactory to patient Vital Signs Assessment: post-procedure vital signs reviewed and stable Respiratory status: spontaneous breathing Cardiovascular status: stable Anesthetic complications: no   No notable events documented.   Last Vitals:  Vitals:   09/18/23 1040 09/18/23 1050  BP: 101/67 105/74  Pulse: (!) 50 65  Resp: (!) 24 18  Temp:    SpO2: 91% 96%    Last Pain:  Vitals:   09/18/23 1050  TempSrc:   PainSc: 0-No pain                 VAN STAVEREN,Andreu Castrejon

## 2023-09-18 NOTE — Interval H&P Note (Signed)
 History and Physical Interval Note:  09/18/2023 10:26 AM  Jonathan Rhodes  has presented today for surgery, with the diagnosis of UPPER ABDOMINAL PAIN,GERD.  The various methods of treatment have been discussed with the patient and family. After consideration of risks, benefits and other options for treatment, the patient has consented to  Procedure(s): EGD (ESOPHAGOGASTRODUODENOSCOPY) (N/A) as a surgical intervention.  The patient's history has been reviewed, patient examined, no change in status, stable for surgery.  I have reviewed the patient's chart and labs.  Questions were answered to the patient's satisfaction.     [name]  Ok to proceed with EGD

## 2023-09-18 NOTE — Transfer of Care (Signed)
 Immediate Anesthesia Transfer of Care Note  Patient: Jonathan Rhodes  Procedure(s) Performed: EGD (ESOPHAGOGASTRODUODENOSCOPY)  Patient Location: PACU  Anesthesia Type:General  Level of Consciousness: awake, alert , and oriented  Airway & Oxygen Therapy: Patient Spontanous Breathing  Post-op Assessment: Report given to RN and Post -op Vital signs reviewed and stable  Post vital signs: Reviewed and stable  Last Vitals:  Vitals Value Taken Time  BP 101/67 09/18/23 10:40  Temp    Pulse 65 09/18/23 10:42  Resp 22 09/18/23 10:42  SpO2 94 % 09/18/23 10:42  Vitals shown include unfiled device data.  Last Pain:  Vitals:   09/18/23 1040  TempSrc:   PainSc: Asleep         Complications: No notable events documented.

## 2023-09-18 NOTE — Anesthesia Preprocedure Evaluation (Signed)
 Anesthesia Evaluation  Patient identified by MRN, date of birth, ID band Patient awake    Reviewed: Allergy & Precautions, NPO status , Patient's Chart, lab work & pertinent test results  Airway Mallampati: II  TM Distance: >3 FB Neck ROM: full    Dental  (+) Teeth Intact   Pulmonary neg pulmonary ROS, Current Smoker and Patient abstained from smoking.   Pulmonary exam normal  + decreased breath sounds      Cardiovascular Exercise Tolerance: Good negative cardio ROS Normal cardiovascular exam Rhythm:Regular Rate:Normal     Neuro/Psych negative neurological ROS  negative psych ROS   GI/Hepatic negative GI ROS, Neg liver ROS,,,  Endo/Other  negative endocrine ROS  Class 3 obesity  Renal/GU negative Renal ROS  negative genitourinary   Musculoskeletal   Abdominal  (+) + obese  Peds negative pediatric ROS (+)  Hematology negative hematology ROS (+)   Anesthesia Other Findings No past medical history on file.  No past surgical history on file.     Reproductive/Obstetrics negative OB ROS                              Anesthesia Physical Anesthesia Plan  ASA: 3  Anesthesia Plan: General   Post-op Pain Management:    Induction: Intravenous  PONV Risk Score and Plan: Propofol  infusion and TIVA  Airway Management Planned: Natural Airway and Nasal Cannula  Additional Equipment:   Intra-op Plan:   Post-operative Plan:   Informed Consent: I have reviewed the patients History and Physical, chart, labs and discussed the procedure including the risks, benefits and alternatives for the proposed anesthesia with the patient or authorized representative who has indicated his/her understanding and acceptance.     Dental Advisory Given  Plan Discussed with: CRNA  Anesthesia Plan Comments:         Anesthesia Quick Evaluation

## 2023-09-23 LAB — SURGICAL PATHOLOGY
